# Patient Record
Sex: Male | Born: 2000 | Race: White | Hispanic: No | Marital: Single | Smoking: Never smoker
Health system: Southern US, Community
[De-identification: ages and names within clinical notes are randomized; demographics above are authoritative.]

---

## 2019-04-12 ENCOUNTER — Ambulatory Visit: Payer: Self-pay | Admitting: Family Medicine

## 2020-01-01 ENCOUNTER — Emergency Department (HOSPITAL_COMMUNITY): Payer: Self-pay

## 2020-01-01 ENCOUNTER — Emergency Department (HOSPITAL_COMMUNITY)
Admission: EM | Admit: 2020-01-01 | Discharge: 2020-01-01 | Disposition: A | Discharge status: Home / Self Care | Payer: Self-pay | Attending: Emergency Medicine | Admitting: Emergency Medicine

## 2020-01-01 ENCOUNTER — Other Ambulatory Visit: Payer: Self-pay

## 2020-01-01 ENCOUNTER — Encounter (HOSPITAL_COMMUNITY): Payer: Self-pay

## 2020-01-01 DIAGNOSIS — R000 Tachycardia, unspecified: Secondary | ICD-10-CM | POA: Insufficient documentation

## 2020-01-01 DIAGNOSIS — F1210 Cannabis abuse, uncomplicated: Secondary | ICD-10-CM | POA: Insufficient documentation

## 2020-01-01 DIAGNOSIS — F419 Anxiety disorder, unspecified: Secondary | ICD-10-CM | POA: Insufficient documentation

## 2020-01-01 DIAGNOSIS — R079 Chest pain, unspecified: Secondary | ICD-10-CM | POA: Insufficient documentation

## 2020-01-01 LAB — RAPID URINE DRUG SCREEN, HOSP PERFORMED
Amphetamines: NOT DETECTED
Barbiturates: NOT DETECTED
Benzodiazepines: NOT DETECTED
Cocaine: NOT DETECTED
Opiates: NOT DETECTED
Tetrahydrocannabinol: POSITIVE — AB

## 2020-01-01 LAB — URINALYSIS, ROUTINE W REFLEX MICROSCOPIC
Bacteria, UA: NONE SEEN
Bilirubin Urine: NEGATIVE
Glucose, UA: NEGATIVE mg/dL
Ketones, ur: 20 mg/dL — AB
Leukocytes,Ua: NEGATIVE
Nitrite: NEGATIVE
Protein, ur: NEGATIVE mg/dL
Specific Gravity, Urine: 1.008 (ref 1.005–1.030)
pH: 7 (ref 5.0–8.0)

## 2020-01-01 LAB — BASIC METABOLIC PANEL
Anion gap: 12 (ref 5–15)
BUN: 12 mg/dL (ref 6–20)
CO2: 22 mmol/L (ref 22–32)
Calcium: 9.8 mg/dL (ref 8.9–10.3)
Chloride: 103 mmol/L (ref 98–111)
Creatinine, Ser: 1.18 mg/dL (ref 0.61–1.24)
GFR, Estimated: >60 mL/min (ref >60)
Glucose, Bld: 148 mg/dL — ABNORMAL HIGH (ref 70–99)
Potassium: 3.8 mmol/L (ref 3.5–5.1)
Sodium: 137 mmol/L (ref 135–145)

## 2020-01-01 LAB — TROPONIN I (HIGH SENSITIVITY)
Troponin I (High Sensitivity): 6 ng/L (ref <18)
Troponin I (High Sensitivity): 6 ng/L (ref <18)

## 2020-01-01 LAB — CBC
HCT: 44 % (ref 39.0–52.0)
Hemoglobin: 15.3 g/dL (ref 13.0–17.0)
MCH: 29.4 pg (ref 26.0–34.0)
MCHC: 34.8 g/dL (ref 30.0–36.0)
MCV: 84.5 fL (ref 80.0–100.0)
Platelets: 197 10*3/uL (ref 150–400)
RBC: 5.21 MIL/uL (ref 4.22–5.81)
RDW: 11.9 % (ref 11.5–15.5)
WBC: 8.2 10*3/uL (ref 4.0–10.5)
nRBC: 0 % (ref 0.0–0.2)

## 2020-01-01 LAB — D-DIMER, QUANTITATIVE: D-Dimer, Quant: <0.27 ug/mL-FEU (ref 0.00–0.50)

## 2020-01-01 MED ORDER — LORAZEPAM 1 MG PO TABS
1.0000 mg | ORAL_TABLET | Freq: Once | ORAL | Status: AC
  Administered 2020-01-01: 13:00:00 1 mg via ORAL
  Filled 2020-01-01: qty 1

## 2020-01-01 MED ORDER — LORAZEPAM 1 MG PO TABS: 2.0000 mg | ORAL_TABLET | Freq: Once | ORAL | Status: DC

## 2020-01-01 NOTE — ED Notes (Signed)
Off  Floor for x-ray

## 2020-01-01 NOTE — ED Provider Notes (Signed)
[facility] EMERGENCY DEPARTMENT Provider Note   CSN: [id number] Arrival date & time: 01/01/20  1023     History Chief Complaint  Patient presents with  . Chest Pain    Jonathan Gallagher is a 19 y.o. male.  The history is provided by the patient.  Chest Pain Pain location:  L chest Pain quality comment:  Unable to specify Pain radiates to:  Does not radiate Pain severity:  Moderate Onset quality:  Gradual Timing:  Constant Progression:  Unchanged Chronicity:  New Context comment:  Smoked marajuana Relieved by:  Nothing Worsened by:  Nothing Ineffective treatments:  None tried Associated symptoms: no back pain, no cough, no diaphoresis, no dizziness, no fever, no headache, no nausea, no palpitations, no shortness of breath and no vomiting        History reviewed. No pertinent past medical history.  There are no problems to display for this patient.   History reviewed. No pertinent surgical history.     History reviewed. No pertinent family history.  Social History   Tobacco Use  . Smoking status: Never Smoker  . Smokeless tobacco: Never Used    Home Medications Prior to Admission medications   Medication Sig Start Date End Date Taking? Authorizing Provider  acetaminophen (TYLENOL) 500 MG tablet Take 500-1,000 mg by mouth every 6 (six) hours as needed for mild pain (or headaches).   Yes [provider]  ibuprofen (ADVIL) 200 MG tablet Take 400-600 mg by mouth every 6 (six) hours as needed (for headaches or mild pain).   Yes [provider]  montelukast (SINGULAIR) 10 MG tablet Take 10 mg by mouth at bedtime as needed (for seasonal "flares").   Yes [provider]    Allergies    Patient has no known allergies.  Review of Systems   Review of Systems  Constitutional: Negative for chills, diaphoresis and fever.  HENT: Negative for congestion and rhinorrhea.   Respiratory: Negative for cough and shortness of breath.    Cardiovascular: Positive for chest pain. Negative for palpitations.  Gastrointestinal: Negative for diarrhea, nausea and vomiting.  Genitourinary: Negative for difficulty urinating and dysuria.  Musculoskeletal: Negative for arthralgias and back pain.  Skin: Negative for color change and rash.  Neurological: Negative for dizziness, light-headedness and headaches.  Psychiatric/Behavioral: Positive for hallucinations. Negative for suicidal ideas. The patient is nervous/anxious.     Physical Exam Updated Vital Signs BP (!) 122/59   Pulse 69   Temp 99.1 F (37.3 C) (Oral)   Resp 20   SpO2 97%   Physical Exam Vitals and nursing note reviewed.  Constitutional:      General: He is not in acute distress.    Appearance: Normal appearance.  HENT:     Head: Normocephalic and atraumatic.     Nose: No rhinorrhea.  Eyes:     General:        Right eye: No discharge.        Left eye: No discharge.     Conjunctiva/sclera: Conjunctivae normal.  Cardiovascular:     Rate and Rhythm: Regular rhythm. Tachycardia present.  Pulmonary:     Effort: Pulmonary effort is normal.     Breath sounds: No stridor. No decreased breath sounds or wheezing.  Chest:     Chest wall: No tenderness.  Abdominal:     General: Abdomen is flat. There is no distension.     Palpations: Abdomen is soft.  Musculoskeletal:        General:  No deformity or signs of injury.  Skin:    General: Skin is warm and dry.  Neurological:     General: No focal deficit present.     Mental Status: He is alert. Mental status is at baseline.     Motor: No weakness.  Psychiatric:        Mood and Affect: Mood normal.        Behavior: Behavior normal.        Thought Content: Thought content is paranoid. Thought content does not include homicidal or suicidal ideation. Thought content does not include homicidal or suicidal plan.     ED Results / Procedures / Treatments   Labs (all labs ordered are listed, but only abnormal  results are displayed) Labs Reviewed  BASIC METABOLIC PANEL - Abnormal; Notable for the following components:      Result Value   Glucose, Bld 148 (*)    All other components within normal limits  URINALYSIS, ROUTINE W REFLEX MICROSCOPIC - Abnormal; Notable for the following components:   Color, Urine STRAW (*)    Hgb urine dipstick SMALL (*)    Ketones, ur 20 (*)    All other components within normal limits  RAPID URINE DRUG SCREEN, HOSP PERFORMED - Abnormal; Notable for the following components:   Tetrahydrocannabinol POSITIVE (*)    All other components within normal limits  CBC  D-DIMER, QUANTITATIVE (NOT AT [facility])  TROPONIN I (HIGH SENSITIVITY)  TROPONIN I (HIGH SENSITIVITY)    EKG EKG Interpretation  Date/Time:  Monday January 01 2020 10:25:46 EST Ventricular Rate:  73 PR Interval:  134 QRS Duration: 86 QT Interval:  400 QTC Calculation: 440 R Axis:   98 Text Interpretation: Normal sinus rhythm with sinus arrhythmia Rightward axis T wave abnormality, consider inferior ischemia Abnormal ECG Confirmed by Cherlynn Perches ([id number]) on 01/01/2020 12:18:34 PM   Radiology DG Chest 2 View  Result Date: 01/01/2020 CLINICAL DATA:  Chest pain. EXAM: CHEST - 2 VIEW COMPARISON:  None. FINDINGS: The cardiac silhouette, mediastinal and hilar contours are within normal limits. The lungs are clear. No pleural effusions. The bony thorax is intact. IMPRESSION: Normal chest x-ray. Electronically Signed   By: Rudie Meyer M.D.   On: 01/01/2020 13:00    Procedures Procedures (including critical care time)  Medications Ordered in ED Medications  LORazepam (ATIVAN) tablet 1 mg (1 mg Oral Given 01/01/20 1241)    ED Course  I have reviewed the triage vital signs and the nursing notes.  Pertinent labs & imaging results that were available during my care of the patient were reviewed by me and considered in my medical decision making (see chart for details).    MDM Rules/Calculators/A&P                           Patient comes in after having smoked marijuana, he is a daily habitual marijuana user, says it feels like the first time he ever used marijuana.  He says people in the waiting room watching him and he does not know what they are looking at him for.  Showing some signs of paranoia, says he may have heard the voice of God but no command hallucinations.  No suicidal homicidal ideation.  Has chest pain that is waxing and waning.  His vital signs are stable as noted tachycardia, cannot use PERC but otherwise low risk Wells.  Will get a second troponin.  EKG shows sinus rhythm without acute ischemic  change interval abnormality or arrhythmia.  Concerned he has some marijuana induced paranoia and hallucinations.  Ativan is given.  Reviewed the patient's labs thus far unremarkable other than THC intoxication.  I spoke to the patient's mother.  She confirms he is not a danger to himself or others.  She has noticed his anxiety depression getting worse.  However she feels safe with outpatient management as a plan once we rule out life-threatening emergent pathology here today.  Chest x-ray reviewed by myself and radiology shows no acute cardiopulmonary pathology. D-dimer negative. Still waiting second troponin.  Patient second troponin is also negative.  Vital signs much improved.  Patient's mood and anxiety is much improved.  Feels comfortable with outpatient management, family also feels comfortable resources provided strict return precautions given  Final Clinical Impression(s) / ED Diagnoses Final diagnoses:  Anxiety  Tetrahydrocannabinol (THC) use disorder, mild, abuse    Rx / DC Orders ED Discharge Orders    None       Sabino Donovan, MD 01/01/20 1454

## 2020-01-01 NOTE — ED Triage Notes (Addendum)
Pt BIB EMS from home due to cp starting around 2am. Pt reports he did have weed. Pt reports he smoked it around 2am. . Pt reports he is now feeling anxious, vomiting . Pt reports he is also having hallucinations. Pt reports he feels depressed but no SI or HI.

## 2020-01-03 ENCOUNTER — Telehealth: Payer: Self-pay | Admitting: *Deleted

## 2020-01-03 NOTE — Telephone Encounter (Signed)
Pt mom called regarding pt still being paranoid and not being able to sleep.  Mom requesting Ativan Rx.  RNCM suggested returning pt to an emergency department or urgent care for immediate help.  Mom appreciative of advice.

## 2021-06-17 IMAGING — CR DG CHEST 2V
2 series · 2 of 2 positions shown · non-contrast
Comparison: None.

CLINICAL DATA: Chest pain.

EXAM:
CHEST - 2 VIEW

[chest pa]
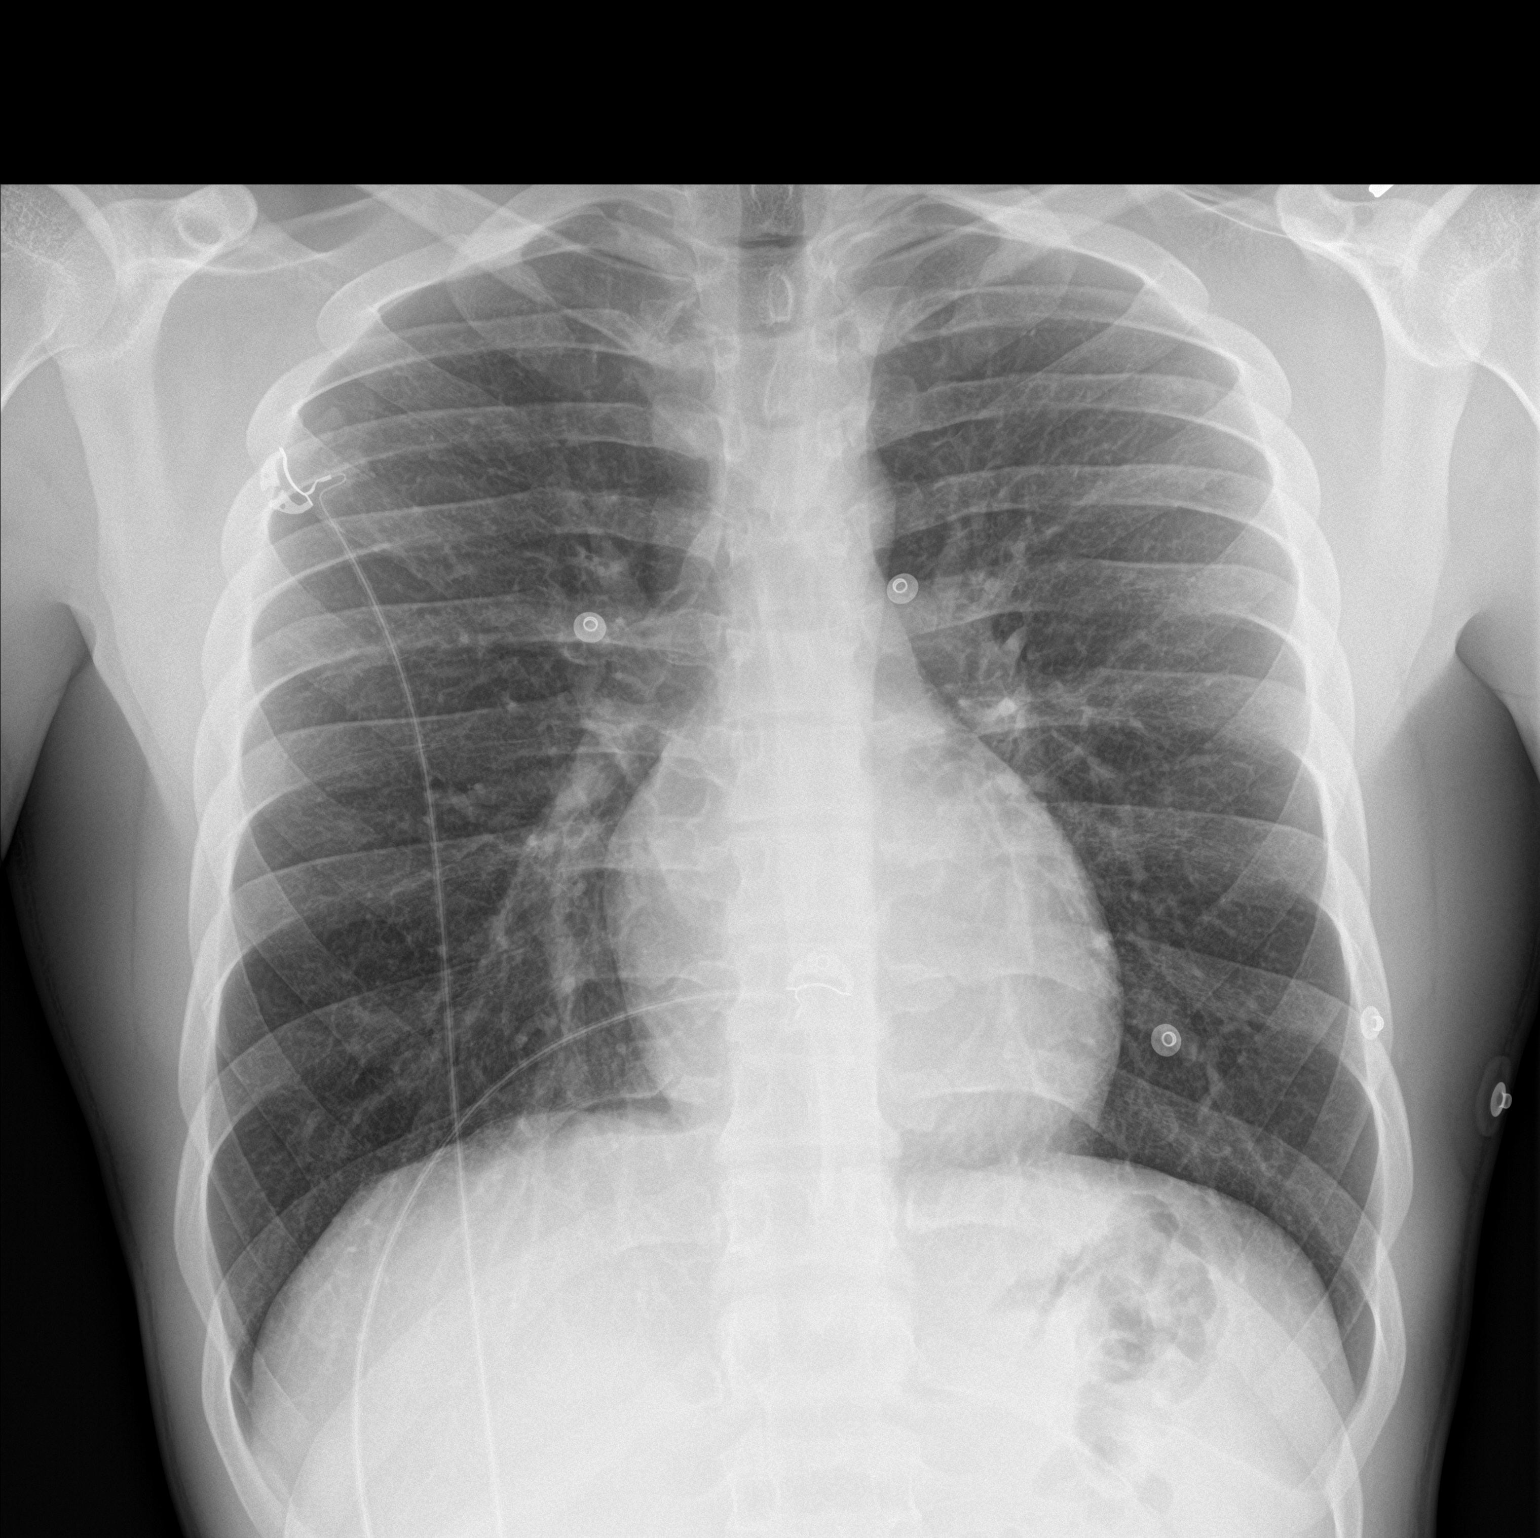

[chest lat]
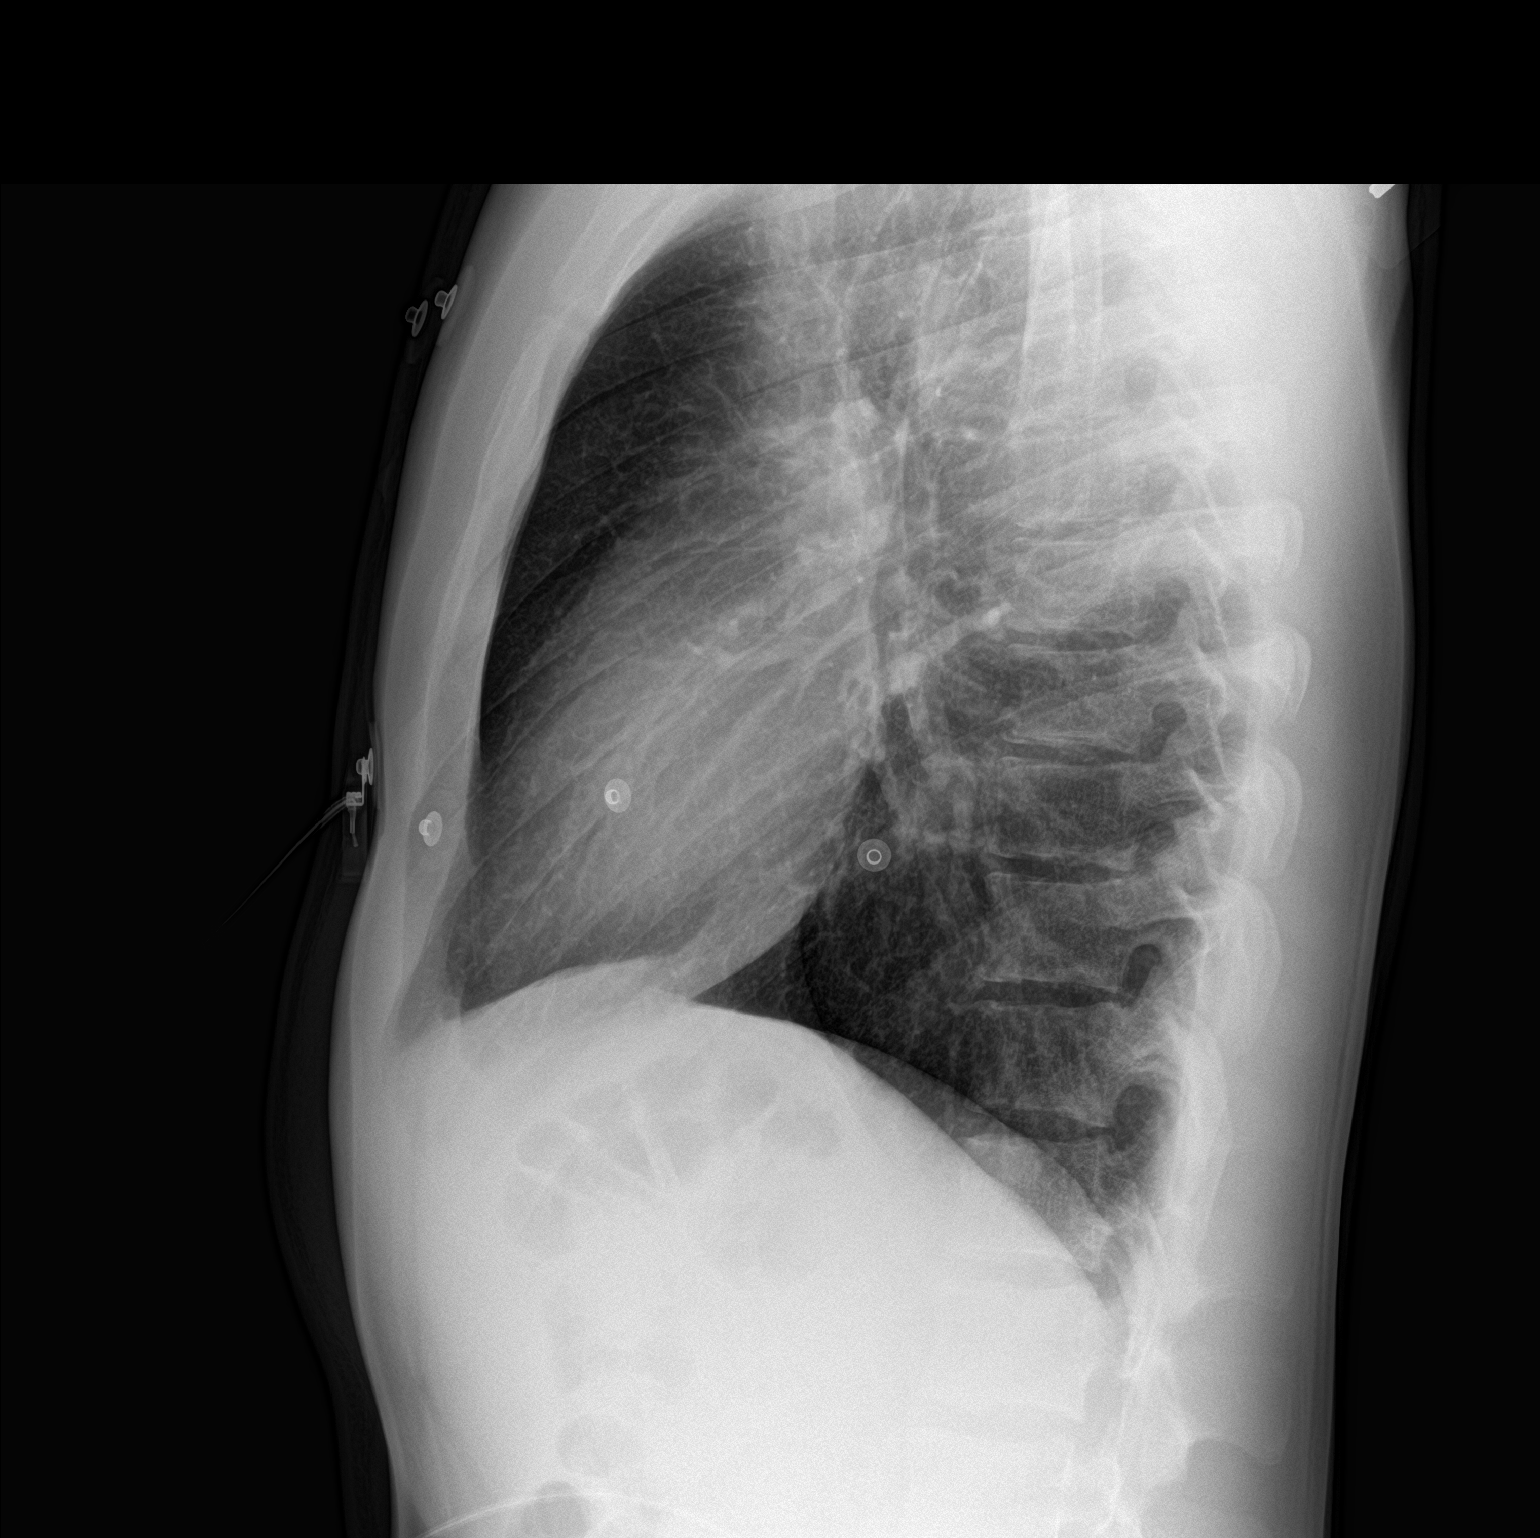

[2 of 2 positions shown; findings below may reference images not displayed]

FINDINGS: The cardiac silhouette, mediastinal and hilar contours are within
normal limits. The lungs are clear. No pleural effusions. The bony
thorax is intact.
IMPRESSION: Normal chest x-ray.
